# Patient Record
Sex: Female | Born: 1955 | Race: Black or African American | Hispanic: No | Marital: Married | Smoking: Former smoker
Health system: Southern US, Community
[De-identification: ages and names within clinical notes are randomized; demographics above are authoritative.]

## PROBLEM LIST (undated history)

## (undated) DIAGNOSIS — N390 Urinary tract infection, site not specified: Secondary | ICD-10-CM

## (undated) DIAGNOSIS — R519 Headache, unspecified: Secondary | ICD-10-CM

## (undated) DIAGNOSIS — F329 Major depressive disorder, single episode, unspecified: Secondary | ICD-10-CM

## (undated) DIAGNOSIS — F32A Depression, unspecified: Secondary | ICD-10-CM

## (undated) DIAGNOSIS — D649 Anemia, unspecified: Secondary | ICD-10-CM

## (undated) DIAGNOSIS — I10 Essential (primary) hypertension: Secondary | ICD-10-CM

## (undated) DIAGNOSIS — F419 Anxiety disorder, unspecified: Secondary | ICD-10-CM

## (undated) DIAGNOSIS — K649 Unspecified hemorrhoids: Secondary | ICD-10-CM

## (undated) DIAGNOSIS — R1013 Epigastric pain: Secondary | ICD-10-CM

## (undated) DIAGNOSIS — H9311 Tinnitus, right ear: Secondary | ICD-10-CM

## (undated) DIAGNOSIS — K219 Gastro-esophageal reflux disease without esophagitis: Secondary | ICD-10-CM

## (undated) DIAGNOSIS — T7840XA Allergy, unspecified, initial encounter: Secondary | ICD-10-CM

## (undated) DIAGNOSIS — R51 Headache: Secondary | ICD-10-CM

## (undated) HISTORY — PX: ESOPHAGOGASTRODUODENOSCOPY: SHX1529

## (undated) HISTORY — PX: COLONOSCOPY: SHX174

## (undated) HISTORY — PX: TONSILLECTOMY: SUR1361

## (undated) HISTORY — PX: TUBAL LIGATION: SHX77

---

## 2001-04-23 ENCOUNTER — Encounter: Payer: Self-pay | Admitting: Otolaryngology

## 2001-04-23 ENCOUNTER — Encounter: Admission: RE | Admit: 2001-04-23 | Discharge: 2001-04-23 | Payer: Self-pay | Admitting: Otolaryngology

## 2004-10-01 ENCOUNTER — Ambulatory Visit: Payer: Self-pay

## 2005-03-10 ENCOUNTER — Emergency Department: Payer: Self-pay | Admitting: Emergency Medicine

## 2006-03-15 ENCOUNTER — Ambulatory Visit: Payer: Self-pay | Admitting: Unknown Physician Specialty

## 2006-04-06 ENCOUNTER — Ambulatory Visit: Payer: Self-pay | Admitting: Otolaryngology

## 2006-10-26 ENCOUNTER — Ambulatory Visit: Payer: Self-pay

## 2007-11-09 ENCOUNTER — Ambulatory Visit: Payer: Self-pay

## 2010-11-01 ENCOUNTER — Emergency Department: Payer: Self-pay | Admitting: Emergency Medicine

## 2012-03-17 ENCOUNTER — Ambulatory Visit: Payer: Self-pay | Admitting: Obstetrics and Gynecology

## 2016-04-17 ENCOUNTER — Encounter: Payer: Self-pay | Admitting: *Deleted

## 2016-04-22 ENCOUNTER — Ambulatory Visit: Payer: BLUE CROSS/BLUE SHIELD | Admitting: Certified Registered Nurse Anesthetist

## 2016-04-22 ENCOUNTER — Ambulatory Visit
Admission: RE | Admit: 2016-04-22 | Discharge: 2016-04-22 | Disposition: A | Discharge status: Home / Self Care | Payer: BLUE CROSS/BLUE SHIELD | Source: Ambulatory Visit | Attending: Unknown Physician Specialty | Admitting: Unknown Physician Specialty

## 2016-04-22 ENCOUNTER — Encounter
Admission: RE | Disposition: A | Discharge status: Home / Self Care | Payer: Self-pay | Source: Ambulatory Visit | Attending: Unknown Physician Specialty

## 2016-04-22 ENCOUNTER — Encounter: Payer: Self-pay | Admitting: Certified Registered Nurse Anesthetist

## 2016-04-22 DIAGNOSIS — F329 Major depressive disorder, single episode, unspecified: Secondary | ICD-10-CM | POA: Insufficient documentation

## 2016-04-22 DIAGNOSIS — D122 Benign neoplasm of ascending colon: Secondary | ICD-10-CM | POA: Diagnosis not present

## 2016-04-22 DIAGNOSIS — K640 First degree hemorrhoids: Secondary | ICD-10-CM | POA: Diagnosis not present

## 2016-04-22 DIAGNOSIS — Z1211 Encounter for screening for malignant neoplasm of colon: Secondary | ICD-10-CM | POA: Insufficient documentation

## 2016-04-22 DIAGNOSIS — D120 Benign neoplasm of cecum: Secondary | ICD-10-CM | POA: Insufficient documentation

## 2016-04-22 DIAGNOSIS — I10 Essential (primary) hypertension: Secondary | ICD-10-CM | POA: Diagnosis not present

## 2016-04-22 DIAGNOSIS — F419 Anxiety disorder, unspecified: Secondary | ICD-10-CM | POA: Diagnosis not present

## 2016-04-22 DIAGNOSIS — Z79899 Other long term (current) drug therapy: Secondary | ICD-10-CM | POA: Diagnosis not present

## 2016-04-22 DIAGNOSIS — K219 Gastro-esophageal reflux disease without esophagitis: Secondary | ICD-10-CM | POA: Insufficient documentation

## 2016-04-22 DIAGNOSIS — Z87891 Personal history of nicotine dependence: Secondary | ICD-10-CM | POA: Diagnosis not present

## 2016-04-22 HISTORY — DX: Anemia, unspecified: D64.9

## 2016-04-22 HISTORY — DX: Unspecified hemorrhoids: K64.9

## 2016-04-22 HISTORY — DX: Depression, unspecified: F32.A

## 2016-04-22 HISTORY — DX: Allergy, unspecified, initial encounter: T78.40XA

## 2016-04-22 HISTORY — DX: Major depressive disorder, single episode, unspecified: F32.9

## 2016-04-22 HISTORY — DX: Headache: R51

## 2016-04-22 HISTORY — DX: Headache, unspecified: R51.9

## 2016-04-22 HISTORY — PX: COLONOSCOPY WITH PROPOFOL: SHX5780

## 2016-04-22 HISTORY — DX: Epigastric pain: R10.13

## 2016-04-22 HISTORY — DX: Tinnitus, right ear: H93.11

## 2016-04-22 HISTORY — DX: Essential (primary) hypertension: I10

## 2016-04-22 HISTORY — DX: Urinary tract infection, site not specified: N39.0

## 2016-04-22 HISTORY — DX: Anxiety disorder, unspecified: F41.9

## 2016-04-22 HISTORY — DX: Gastro-esophageal reflux disease without esophagitis: K21.9

## 2016-04-22 SURGERY — COLONOSCOPY WITH PROPOFOL
Anesthesia: General

## 2016-04-22 MED ORDER — SODIUM CHLORIDE 0.9 % IV SOLN
INTRAVENOUS | Status: DC
  Administered 2016-04-22: 1000 mL via INTRAVENOUS

## 2016-04-22 MED ORDER — LIDOCAINE 2% (20 MG/ML) 5 ML SYRINGE
INTRAMUSCULAR | Status: AC
  Filled 2016-04-22: qty 5

## 2016-04-22 MED ORDER — PROPOFOL 10 MG/ML IV BOLUS
INTRAVENOUS | Status: DC | PRN
  Administered 2016-04-22: 30 mg via INTRAVENOUS

## 2016-04-22 MED ORDER — PROPOFOL 500 MG/50ML IV EMUL
INTRAVENOUS | Status: DC | PRN
  Administered 2016-04-22: 140 ug/kg/min via INTRAVENOUS

## 2016-04-22 MED ORDER — SODIUM CHLORIDE 0.9 % IV SOLN: INTRAVENOUS | Status: DC

## 2016-04-22 MED ORDER — MIDAZOLAM HCL 2 MG/2ML IJ SOLN
INTRAMUSCULAR | Status: AC
  Filled 2016-04-22: qty 2

## 2016-04-22 MED ORDER — LIDOCAINE HCL (PF) 1 % IJ SOLN
INTRAMUSCULAR | Status: AC
  Administered 2016-04-22: 0.4 mL
  Filled 2016-04-22: qty 2

## 2016-04-22 MED ORDER — MIDAZOLAM HCL 2 MG/2ML IJ SOLN
INTRAMUSCULAR | Status: DC | PRN
  Administered 2016-04-22: 1 mg via INTRAVENOUS

## 2016-04-22 MED ORDER — PROPOFOL 500 MG/50ML IV EMUL
INTRAVENOUS | Status: AC
  Filled 2016-04-22: qty 50

## 2016-04-22 MED ORDER — LIDOCAINE HCL (CARDIAC) 20 MG/ML IV SOLN
INTRAVENOUS | Status: DC | PRN
  Administered 2016-04-22: 50 mg via INTRAVENOUS

## 2016-04-22 NOTE — Anesthesia Procedure Notes (Signed)
Date/Time: 04/22/2016 9:15 AM Performed by: Johnna Acosta Pre-anesthesia Checklist: Patient identified, Emergency Drugs available, Suction available, Patient being monitored and Timeout performed Patient Re-evaluated:Patient Re-evaluated prior to inductionOxygen Delivery Method: Nasal cannula

## 2016-04-22 NOTE — H&P (Signed)
   Primary Care Physician:  No primary care provider on file. Primary Gastroenterologist:  Dr. Vira Agar  Pre-Procedure History & Physical: HPI:  Karen Mcfarland is a 60 y.o. female is here for an colonoscopy.   Past Medical History:  Diagnosis Date  . Allergic state   . Anemia   . Anxiety   . Depression   . Dyspepsia   . GERD (gastroesophageal reflux disease)   . Headache   . Hemorrhoids   . Hypertension   . Recurrent UTI   . Tinnitus of right ear     Past Surgical History:  Procedure Laterality Date  . COLONOSCOPY    . ESOPHAGOGASTRODUODENOSCOPY    . TONSILLECTOMY    . TUBAL LIGATION      Prior to Admission medications   Medication Sig Start Date End Date Taking? Authorizing Provider  citalopram (CELEXA) 10 MG tablet Take 10 mg by mouth daily.   Yes Historical Provider, MD  pantoprazole (PROTONIX) 40 MG tablet Take 40 mg by mouth daily.   Yes Historical Provider, MD    Allergies as of 04/13/2016  . (Not on File)    History reviewed. No pertinent family history.  Social History   Social History  . Marital status: Married    Spouse name: N/A  . Number of children: N/A  . Years of education: N/A   Occupational History  . Not on file.   Social History Main Topics  . Smoking status: Former Research scientist (life sciences)  . Smokeless tobacco: Former Systems developer    Types: Snuff  . Alcohol use No  . Drug use: No  . Sexual activity: Not on file   Other Topics Concern  . Not on file   Social History Narrative  . No narrative on file    Review of Systems: See HPI, otherwise negative ROS  Physical Exam: BP 138/82   Pulse 72   Temp 97.9 F (36.6 C) (Tympanic)   Resp 18   Ht 5\' 6"  (1.676 m)   Wt 77.1 kg (170 lb)   SpO2 100%   BMI 27.44 kg/m  General:   Alert,  pleasant and cooperative in NAD Head:  Normocephalic and atraumatic. Neck:  Supple; no masses or thyromegaly. Lungs:  Clear throughout to auscultation.    Heart:  Regular rate and rhythm. Abdomen:  Soft, nontender and  nondistended. Normal bowel sounds, without guarding, and without rebound.   Neurologic:  Alert and  oriented x4;  grossly normal neurologically.  Impression/Plan: Karen Mcfarland is here for an colonoscopy to be performed for screening  Risks, benefits, limitations, and alternatives regarding  colonoscopy have been reviewed with the patient.  Questions have been answered.  All parties agreeable.   Gaylyn Cheers, MD  04/22/2016, 9:14 AM

## 2016-04-22 NOTE — Anesthesia Preprocedure Evaluation (Signed)
Anesthesia Evaluation  Patient identified by MRN, date of birth, ID band Patient awake    Reviewed: Allergy & Precautions, H&P , NPO status , Patient's Chart, lab work & pertinent test results, reviewed documented beta blocker date and time   Airway Mallampati: II   Neck ROM: full    Dental  (+) Poor Dentition, Teeth Intact   Pulmonary neg pulmonary ROS, former smoker,    Pulmonary exam normal        Cardiovascular hypertension, negative cardio ROS Normal cardiovascular exam Rhythm:regular Rate:Normal     Neuro/Psych  Headaches, negative neurological ROS  negative psych ROS   GI/Hepatic negative GI ROS, Neg liver ROS, GERD  Medicated,  Endo/Other  negative endocrine ROS  Renal/GU negative Renal ROS  negative genitourinary   Musculoskeletal   Abdominal   Peds  Hematology negative hematology ROS (+) anemia ,   Anesthesia Other Findings Past Medical History: No date: Allergic state No date: Anemia No date: Anxiety No date: Depression No date: Dyspepsia No date: GERD (gastroesophageal reflux disease) No date: Headache No date: Hemorrhoids No date: Hypertension No date: Recurrent UTI No date: Tinnitus of right ear Past Surgical History: No date: COLONOSCOPY No date: ESOPHAGOGASTRODUODENOSCOPY No date: TONSILLECTOMY No date: TUBAL LIGATION BMI    Body Mass Index:  27.44 kg/m     Reproductive/Obstetrics negative OB ROS                             Anesthesia Physical Anesthesia Plan  ASA: II  Anesthesia Plan: General   Post-op Pain Management:    Induction:   Airway Management Planned:   Additional Equipment:   Intra-op Plan:   Post-operative Plan:   Informed Consent: I have reviewed the patients History and Physical, chart, labs and discussed the procedure including the risks, benefits and alternatives for the proposed anesthesia with the patient or authorized  representative who has indicated his/her understanding and acceptance.   Dental Advisory Given  Plan Discussed with: CRNA  Anesthesia Plan Comments:         Anesthesia Quick Evaluation

## 2016-04-22 NOTE — Transfer of Care (Signed)
Immediate Anesthesia Transfer of Care Note  Patient: Karen Mcfarland  Procedure(s) Performed: Procedure(s): COLONOSCOPY WITH PROPOFOL (N/A)  Patient Location: PACU  Anesthesia Type:General  Level of Consciousness: awake  Airway & Oxygen Therapy: Patient Spontanous Breathing and Patient connected to nasal cannula oxygen  Post-op Assessment: Report given to RN and Post -op Vital signs reviewed and stable  Post vital signs: Reviewed and stable  Last Vitals:  Vitals:   04/22/16 0940 04/22/16 0944  BP: 93/72 93/70  Pulse: 74 69  Resp: 18 15  Temp: (!) 35.9 C (!) 35.9 C    Last Pain:  Vitals:   04/22/16 0944  TempSrc: Tympanic         Complications: No apparent anesthesia complications

## 2016-04-22 NOTE — Op Note (Signed)
[facility] Gastroenterology Patient Name: Karen Mcfarland Procedure Date: 04/22/2016 9:16 AM MRN: [id number] Account #: [id number] Date of Birth: May 19, 1955 Admit Type: Outpatient Age: 60 Room: [facility] ENDO ROOM 4 Gender: Female Note Status: Finalized Procedure:            Colonoscopy Indications:          Screening for colorectal malignant neoplasm Providers:            Manya Silvas, MD Referring MD:         Adrian Prows (Referring MD) Medicines:            Propofol per Anesthesia Complications:        No immediate complications. Procedure:            Pre-Anesthesia Assessment:                       - After reviewing the risks and benefits, the patient                        was deemed in satisfactory condition to undergo the                        procedure.                       After obtaining informed consent, the colonoscope was                        passed under direct vision. Throughout the procedure,                        the patient's blood pressure, pulse, and oxygen                        saturations were monitored continuously. The                        Colonoscope was introduced through the anus and                        advanced to the the cecum, identified by appendiceal                        orifice and ileocecal valve. The colonoscopy was                        performed without difficulty. The patient tolerated the                        procedure well. The quality of the bowel preparation                        was excellent. Findings:      Two sessile polyps were found in the ascending colon and cecum. The       polyps were small in size. These polyps were removed with a hot snare.       Resection and retrieval were complete.      Internal hemorrhoids were found during endoscopy. The hemorrhoids were       small and Grade I (internal hemorrhoids that do not prolapse).      The  exam was otherwise without abnormality. Impression:            - Two small polyps in the ascending colon and in the                        cecum, removed with a hot snare. Resected and retrieved.                       - Internal hemorrhoids.                       - The examination was otherwise normal. Recommendation:       - Await pathology results. Manya Silvas, MD 04/22/2016 9:40:34 AM This report has been signed electronically. Number of Addenda: 0 Note Initiated On: 04/22/2016 9:16 AM Scope Withdrawal Time: 0 hours 11 minutes 41 seconds  Total Procedure Duration: 0 hours 17 minutes 26 seconds       [facility]

## 2016-04-23 ENCOUNTER — Encounter: Payer: Self-pay | Admitting: Unknown Physician Specialty

## 2016-04-23 LAB — SURGICAL PATHOLOGY

## 2016-05-05 NOTE — Anesthesia Postprocedure Evaluation (Signed)
Anesthesia Post Note  Patient: Karen Mcfarland  Procedure(s) Performed: Procedure(s) (LRB): COLONOSCOPY WITH PROPOFOL (N/A)  Patient location during evaluation: PACU Anesthesia Type: General Level of consciousness: awake and alert Pain management: pain level controlled Vital Signs Assessment: post-procedure vital signs reviewed and stable Respiratory status: spontaneous breathing, nonlabored ventilation, respiratory function stable and patient connected to nasal cannula oxygen Cardiovascular status: blood pressure returned to baseline and stable Postop Assessment: no signs of nausea or vomiting Anesthetic complications: no     Last Vitals:  Vitals:   04/22/16 1000 04/22/16 1010  BP: 131/73 117/73  Pulse: 61 62  Resp: 12 15  Temp:      Last Pain:  Vitals:   04/22/16 0944  TempSrc: Tympanic                 Molli Barrows

## 2016-08-14 ENCOUNTER — Ambulatory Visit
Admission: RE | Admit: 2016-08-14 | Discharge: 2016-08-14 | Disposition: A | Discharge status: Home / Self Care | Payer: BLUE CROSS/BLUE SHIELD | Source: Ambulatory Visit | Attending: Infectious Diseases | Admitting: Infectious Diseases

## 2016-08-14 ENCOUNTER — Other Ambulatory Visit: Payer: Self-pay | Admitting: Infectious Diseases

## 2016-08-14 DIAGNOSIS — R51 Headache: Principal | ICD-10-CM

## 2016-08-14 DIAGNOSIS — R519 Headache, unspecified: Secondary | ICD-10-CM

## 2017-08-09 ENCOUNTER — Ambulatory Visit: Payer: Self-pay | Admitting: Pharmacy Technician

## 2017-08-09 ENCOUNTER — Encounter (INDEPENDENT_AMBULATORY_CARE_PROVIDER_SITE_OTHER): Payer: Self-pay

## 2017-08-09 DIAGNOSIS — Z79899 Other long term (current) drug therapy: Secondary | ICD-10-CM

## 2017-08-09 NOTE — Progress Notes (Signed)
Completed [facility] application and contract.  Patient agreed to all terms of the [facility] contract.    Patient approved to receive medication assistance at [facility] through 2019, as long as eligibility criteria continues to be met.    Provided patient with community resource material based on her particular needs.    Referred to [facility].  [facility]

## 2017-08-22 IMAGING — CT CT HEAD W/O CM
3 series · 16 of 47 positions shown, 19 images · non-contrast
Comparison: None.

CLINICAL DATA: Acute intractable headache.

EXAM:
CT HEAD WITHOUT CONTRAST
TECHNIQUE: Contiguous axial images were obtained from the base of the skull
through the vertex without intravenous contrast.

[Series 2: head wo · axial · 0.42mm/px · z∈[-121,+4]mm · 10 of 30 slices shown, 13 images]
[im 3/30  brain]
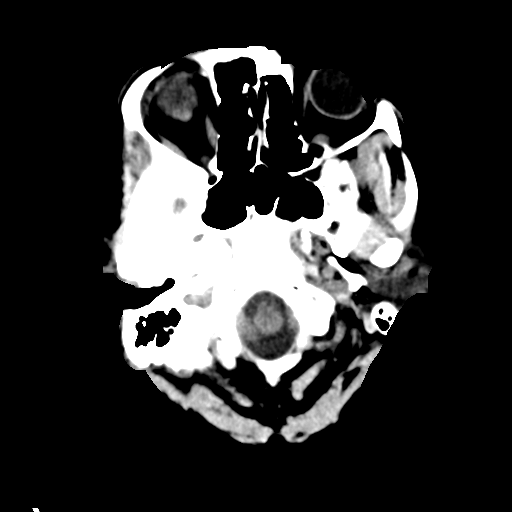
[im 3/30  bone]
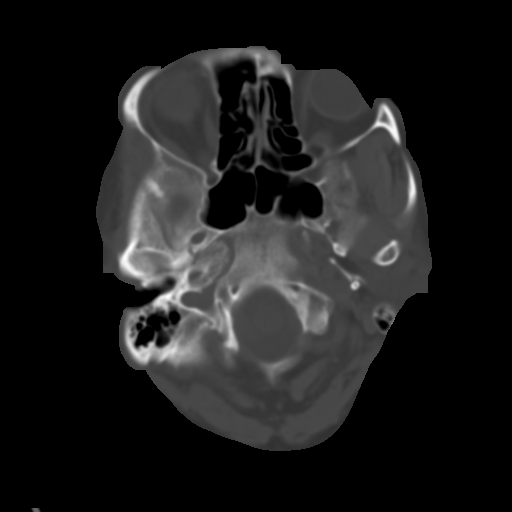
[im 6/30  brain]
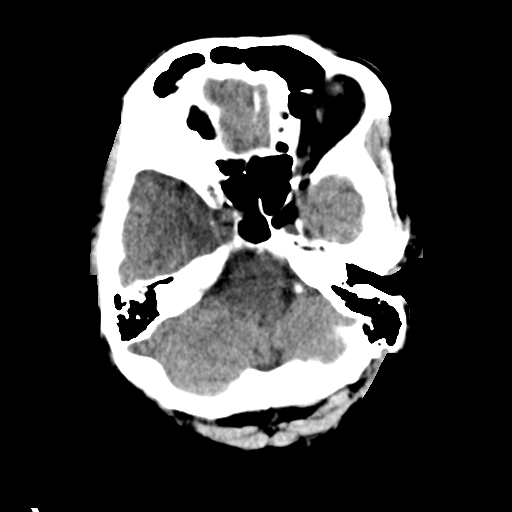
[im 9/30  brain]
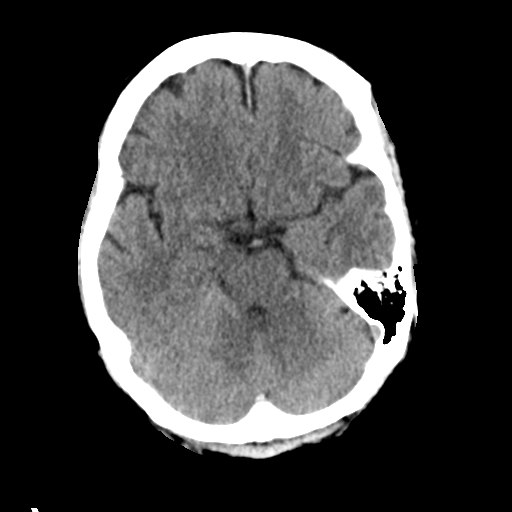
[im 11/30  brain]
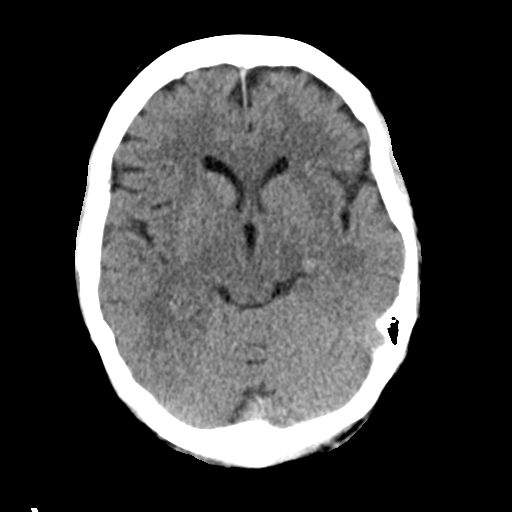
[im 14/30  brain]
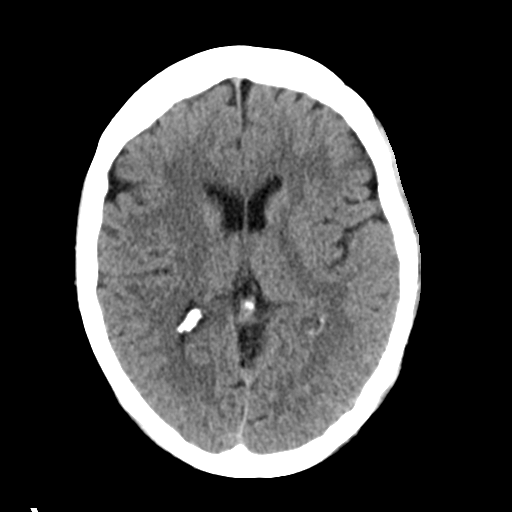
[im 14/30  bone]
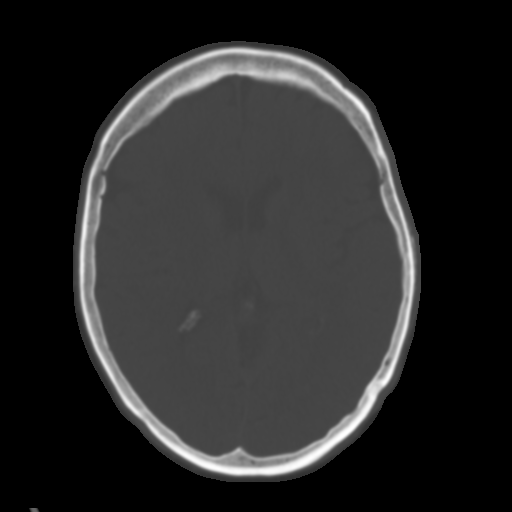
[im 17/30  brain]
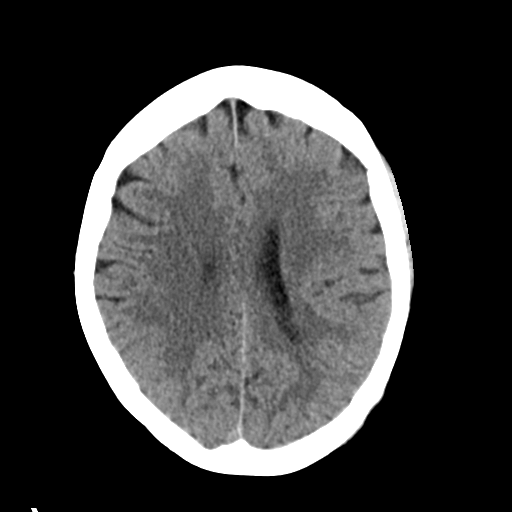
[im 20/30  brain]
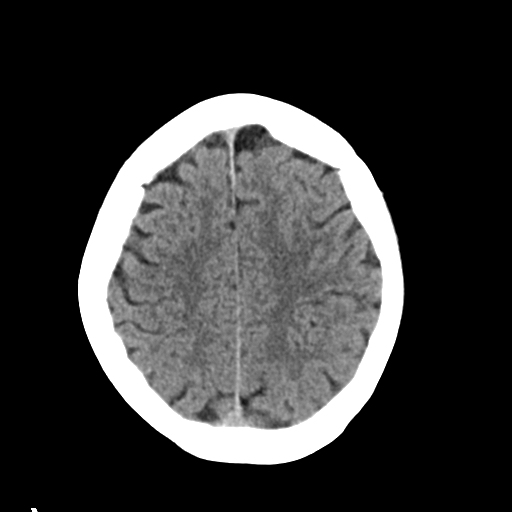
[im 23/30  brain]
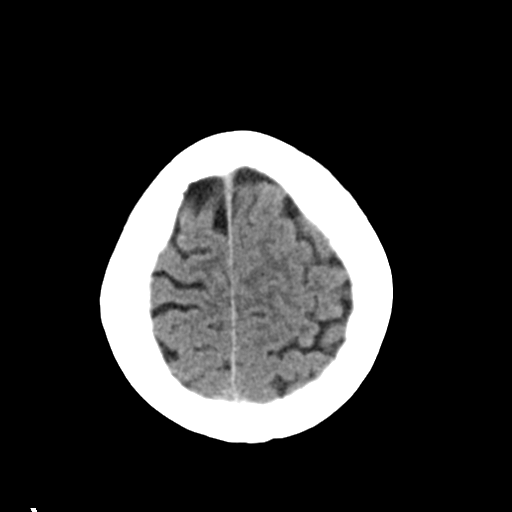
[im 25/30  brain]
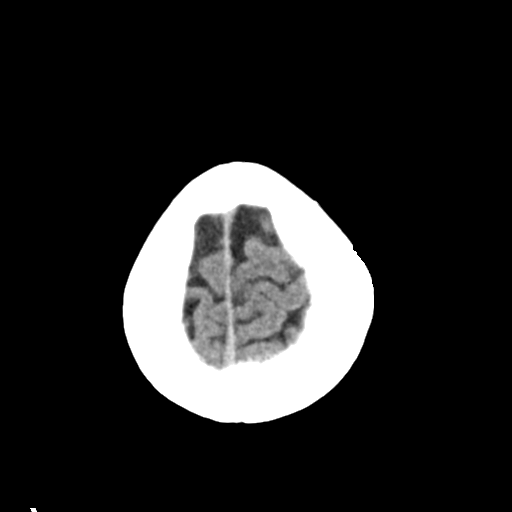
[im 25/30  bone]
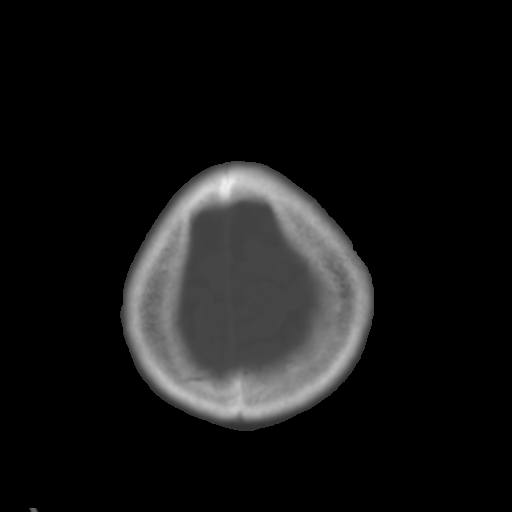
[im 28/30  brain]
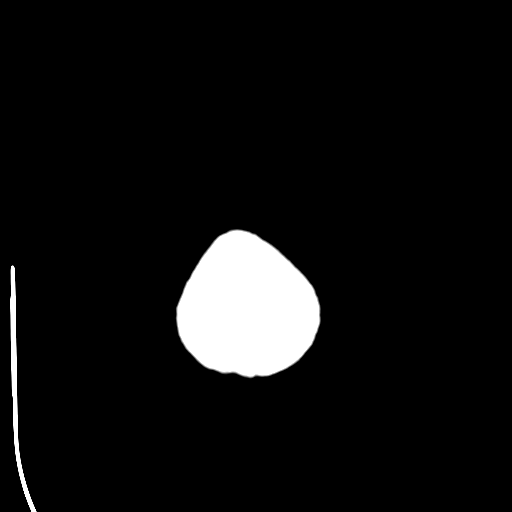

[Series 4: coronal soft tissue · coronal · 0.31mm/px · 3 of 66 slices shown]
[im 22/66  brain]
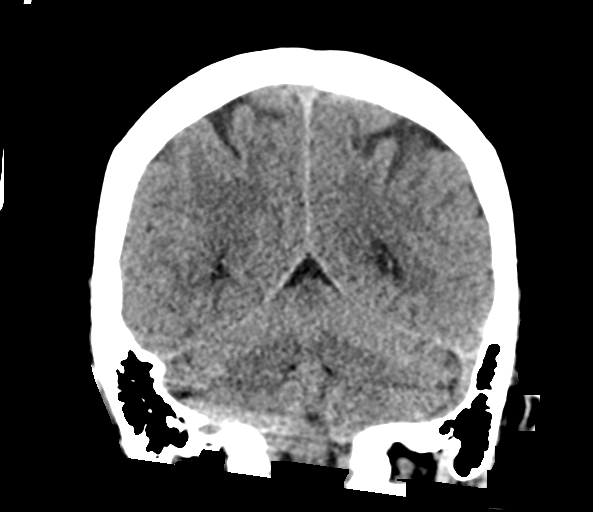
[im 29/66  brain]
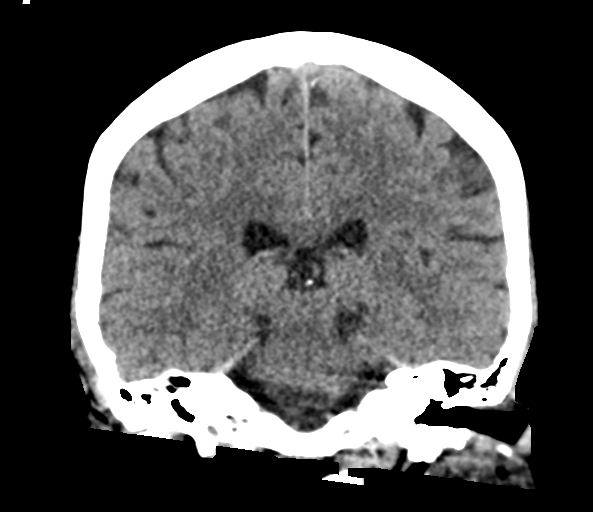
[im 37/66  brain]
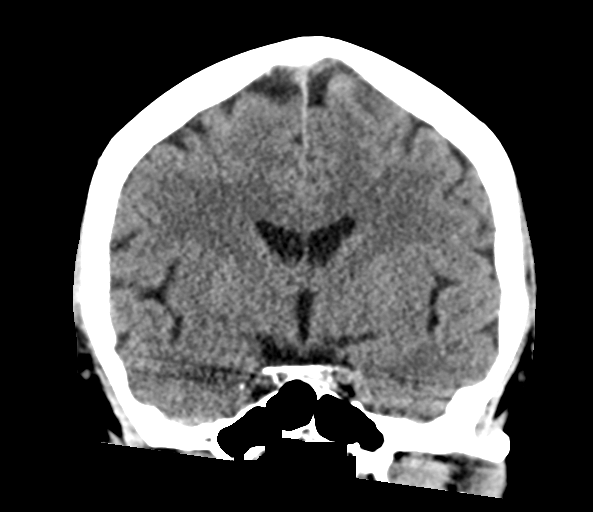

[Series 5: sagittal soft tissue · sagittal · 0.32mm/px · 3 of 53 slices shown]
[im 18/53  brain]
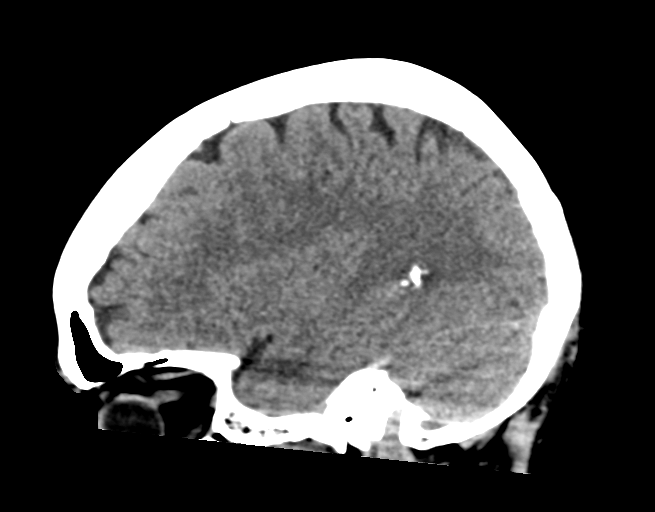
[im 27/53  brain]
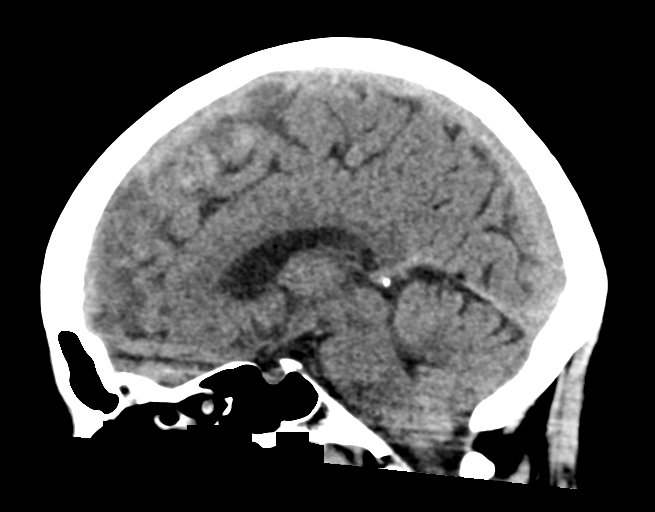
[im 35/53  brain]
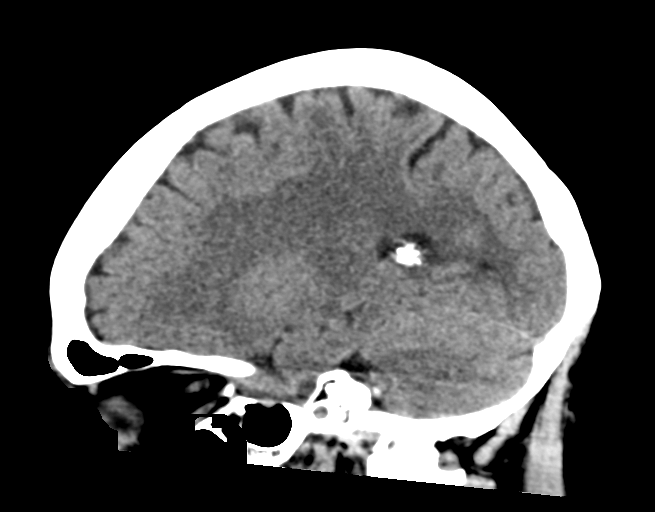

[16 of 47 positions shown; findings below may reference images not displayed]

FINDINGS: Brain: No evidence of acute infarction, hemorrhage, hydrocephalus,
extra-axial collection or mass lesion/mass effect.

Vascular: No hyperdense vessel or unexpected calcification.

Skull: Normal. Negative for fracture or focal lesion.

Sinuses/Orbits: No acute finding.

Other: None.
IMPRESSION: Normal head CT.

## 2019-01-13 ENCOUNTER — Telehealth: Payer: Self-pay | Admitting: Pharmacy Technician

## 2019-01-13 NOTE — Telephone Encounter (Signed)
Patient reached out to [facility] in April 2019 to obtain medication assistance.  Patient never requested medication assistance from [facility] and has not provided 2020 poi.  Patient being made inactive.  Pt notified by letter.  [facility]

## 2019-06-21 ENCOUNTER — Other Ambulatory Visit: Payer: Self-pay | Admitting: Internal Medicine

## 2019-06-21 DIAGNOSIS — Z1231 Encounter for screening mammogram for malignant neoplasm of breast: Secondary | ICD-10-CM

## 2020-02-01 DIAGNOSIS — K219 Gastro-esophageal reflux disease without esophagitis: Secondary | ICD-10-CM | POA: Diagnosis not present

## 2020-02-01 DIAGNOSIS — I10 Essential (primary) hypertension: Secondary | ICD-10-CM | POA: Diagnosis not present

## 2020-02-01 DIAGNOSIS — M8580 Other specified disorders of bone density and structure, unspecified site: Secondary | ICD-10-CM | POA: Diagnosis not present

## 2020-02-01 DIAGNOSIS — Z23 Encounter for immunization: Secondary | ICD-10-CM | POA: Diagnosis not present

## 2020-02-01 DIAGNOSIS — F3341 Major depressive disorder, recurrent, in partial remission: Secondary | ICD-10-CM | POA: Diagnosis not present

## 2020-07-01 ENCOUNTER — Other Ambulatory Visit: Payer: Self-pay | Admitting: Internal Medicine

## 2020-07-01 DIAGNOSIS — F3341 Major depressive disorder, recurrent, in partial remission: Secondary | ICD-10-CM | POA: Diagnosis not present

## 2020-07-01 DIAGNOSIS — Z1231 Encounter for screening mammogram for malignant neoplasm of breast: Secondary | ICD-10-CM | POA: Diagnosis not present

## 2020-07-01 DIAGNOSIS — M25512 Pain in left shoulder: Secondary | ICD-10-CM | POA: Diagnosis not present

## 2020-07-01 DIAGNOSIS — M19012 Primary osteoarthritis, left shoulder: Secondary | ICD-10-CM | POA: Diagnosis not present

## 2020-07-01 DIAGNOSIS — I10 Essential (primary) hypertension: Secondary | ICD-10-CM | POA: Diagnosis not present

## 2020-07-29 DIAGNOSIS — I10 Essential (primary) hypertension: Secondary | ICD-10-CM | POA: Diagnosis not present

## 2020-07-29 DIAGNOSIS — F3341 Major depressive disorder, recurrent, in partial remission: Secondary | ICD-10-CM | POA: Diagnosis not present

## 2020-07-29 DIAGNOSIS — M25512 Pain in left shoulder: Secondary | ICD-10-CM | POA: Diagnosis not present

## 2020-10-10 DIAGNOSIS — J069 Acute upper respiratory infection, unspecified: Secondary | ICD-10-CM | POA: Diagnosis not present

## 2020-10-10 DIAGNOSIS — Z20822 Contact with and (suspected) exposure to covid-19: Secondary | ICD-10-CM | POA: Diagnosis not present

## 2020-11-20 DIAGNOSIS — S91341A Puncture wound with foreign body, right foot, initial encounter: Secondary | ICD-10-CM | POA: Diagnosis not present

## 2020-11-20 DIAGNOSIS — F3341 Major depressive disorder, recurrent, in partial remission: Secondary | ICD-10-CM | POA: Diagnosis not present

## 2020-11-20 DIAGNOSIS — D500 Iron deficiency anemia secondary to blood loss (chronic): Secondary | ICD-10-CM | POA: Diagnosis not present

## 2020-11-20 DIAGNOSIS — M19071 Primary osteoarthritis, right ankle and foot: Secondary | ICD-10-CM | POA: Diagnosis not present

## 2020-11-20 DIAGNOSIS — E559 Vitamin D deficiency, unspecified: Secondary | ICD-10-CM | POA: Diagnosis not present

## 2020-11-20 DIAGNOSIS — I10 Essential (primary) hypertension: Secondary | ICD-10-CM | POA: Diagnosis not present

## 2020-11-20 DIAGNOSIS — R739 Hyperglycemia, unspecified: Secondary | ICD-10-CM | POA: Diagnosis not present

## 2020-11-20 DIAGNOSIS — Z0000 Encounter for general adult medical examination without abnormal findings: Secondary | ICD-10-CM | POA: Diagnosis not present

## 2020-11-20 DIAGNOSIS — Z1389 Encounter for screening for other disorder: Secondary | ICD-10-CM | POA: Diagnosis not present

## 2021-04-03 DIAGNOSIS — K5909 Other constipation: Secondary | ICD-10-CM | POA: Diagnosis not present

## 2021-04-03 DIAGNOSIS — M545 Low back pain, unspecified: Secondary | ICD-10-CM | POA: Diagnosis not present

## 2021-04-03 DIAGNOSIS — S76312A Strain of muscle, fascia and tendon of the posterior muscle group at thigh level, left thigh, initial encounter: Secondary | ICD-10-CM | POA: Diagnosis not present

## 2021-04-03 DIAGNOSIS — R1084 Generalized abdominal pain: Secondary | ICD-10-CM | POA: Diagnosis not present

## 2021-04-03 DIAGNOSIS — R109 Unspecified abdominal pain: Secondary | ICD-10-CM | POA: Diagnosis not present

## 2021-04-03 DIAGNOSIS — K5900 Constipation, unspecified: Secondary | ICD-10-CM | POA: Diagnosis not present

## 2021-05-09 DIAGNOSIS — I10 Essential (primary) hypertension: Secondary | ICD-10-CM | POA: Diagnosis not present

## 2021-05-09 DIAGNOSIS — J069 Acute upper respiratory infection, unspecified: Secondary | ICD-10-CM | POA: Diagnosis not present

## 2021-08-14 DIAGNOSIS — H6693 Otitis media, unspecified, bilateral: Secondary | ICD-10-CM | POA: Diagnosis not present

## 2021-08-14 DIAGNOSIS — Z79899 Other long term (current) drug therapy: Secondary | ICD-10-CM | POA: Diagnosis not present

## 2021-08-14 DIAGNOSIS — I10 Essential (primary) hypertension: Secondary | ICD-10-CM | POA: Diagnosis not present

## 2021-08-14 DIAGNOSIS — M1712 Unilateral primary osteoarthritis, left knee: Secondary | ICD-10-CM | POA: Diagnosis not present

## 2021-11-17 DIAGNOSIS — E559 Vitamin D deficiency, unspecified: Secondary | ICD-10-CM | POA: Diagnosis not present

## 2021-11-17 DIAGNOSIS — I10 Essential (primary) hypertension: Secondary | ICD-10-CM | POA: Diagnosis not present

## 2021-11-17 DIAGNOSIS — R739 Hyperglycemia, unspecified: Secondary | ICD-10-CM | POA: Diagnosis not present

## 2021-11-24 DIAGNOSIS — I10 Essential (primary) hypertension: Secondary | ICD-10-CM | POA: Diagnosis not present

## 2021-11-24 DIAGNOSIS — F411 Generalized anxiety disorder: Secondary | ICD-10-CM | POA: Diagnosis not present

## 2021-11-24 DIAGNOSIS — Z0000 Encounter for general adult medical examination without abnormal findings: Secondary | ICD-10-CM | POA: Diagnosis not present

## 2021-11-24 DIAGNOSIS — Z1231 Encounter for screening mammogram for malignant neoplasm of breast: Secondary | ICD-10-CM | POA: Diagnosis not present

## 2021-11-28 ENCOUNTER — Other Ambulatory Visit: Payer: Self-pay | Admitting: Internal Medicine

## 2021-11-28 DIAGNOSIS — Z1231 Encounter for screening mammogram for malignant neoplasm of breast: Secondary | ICD-10-CM

## 2021-12-25 ENCOUNTER — Ambulatory Visit
Admission: RE | Admit: 2021-12-25 | Discharge: 2021-12-25 | Disposition: A | Discharge status: Home / Self Care | Payer: Medicare HMO | Source: Ambulatory Visit | Attending: Internal Medicine | Admitting: Internal Medicine

## 2021-12-25 ENCOUNTER — Encounter: Payer: Self-pay | Admitting: Radiology

## 2021-12-25 DIAGNOSIS — F411 Generalized anxiety disorder: Secondary | ICD-10-CM | POA: Diagnosis not present

## 2021-12-25 DIAGNOSIS — H6693 Otitis media, unspecified, bilateral: Secondary | ICD-10-CM | POA: Diagnosis not present

## 2021-12-25 DIAGNOSIS — Z1231 Encounter for screening mammogram for malignant neoplasm of breast: Secondary | ICD-10-CM | POA: Diagnosis not present

## 2021-12-25 DIAGNOSIS — I10 Essential (primary) hypertension: Secondary | ICD-10-CM | POA: Diagnosis not present

## 2022-02-06 DIAGNOSIS — I10 Essential (primary) hypertension: Secondary | ICD-10-CM | POA: Diagnosis not present

## 2022-02-06 DIAGNOSIS — F411 Generalized anxiety disorder: Secondary | ICD-10-CM | POA: Diagnosis not present

## 2022-02-06 DIAGNOSIS — F4322 Adjustment disorder with anxiety: Secondary | ICD-10-CM | POA: Diagnosis not present

## 2022-03-03 DIAGNOSIS — J069 Acute upper respiratory infection, unspecified: Secondary | ICD-10-CM | POA: Diagnosis not present

## 2022-03-03 DIAGNOSIS — I10 Essential (primary) hypertension: Secondary | ICD-10-CM | POA: Diagnosis not present

## 2022-05-19 DIAGNOSIS — I10 Essential (primary) hypertension: Secondary | ICD-10-CM | POA: Diagnosis not present

## 2022-05-19 DIAGNOSIS — M170 Bilateral primary osteoarthritis of knee: Secondary | ICD-10-CM | POA: Diagnosis not present

## 2022-05-19 DIAGNOSIS — M25562 Pain in left knee: Secondary | ICD-10-CM | POA: Diagnosis not present

## 2022-05-19 DIAGNOSIS — F411 Generalized anxiety disorder: Secondary | ICD-10-CM | POA: Diagnosis not present

## 2022-05-19 DIAGNOSIS — G8929 Other chronic pain: Secondary | ICD-10-CM | POA: Diagnosis not present

## 2022-07-20 DIAGNOSIS — G8929 Other chronic pain: Secondary | ICD-10-CM | POA: Diagnosis not present

## 2022-07-20 DIAGNOSIS — M19012 Primary osteoarthritis, left shoulder: Secondary | ICD-10-CM | POA: Diagnosis not present

## 2022-07-20 DIAGNOSIS — M25561 Pain in right knee: Secondary | ICD-10-CM | POA: Diagnosis not present

## 2022-07-20 DIAGNOSIS — F411 Generalized anxiety disorder: Secondary | ICD-10-CM | POA: Diagnosis not present

## 2022-07-20 DIAGNOSIS — I10 Essential (primary) hypertension: Secondary | ICD-10-CM | POA: Diagnosis not present

## 2022-07-20 DIAGNOSIS — M7552 Bursitis of left shoulder: Secondary | ICD-10-CM | POA: Diagnosis not present

## 2022-07-22 DIAGNOSIS — M8588 Other specified disorders of bone density and structure, other site: Secondary | ICD-10-CM | POA: Diagnosis not present

## 2022-09-04 DIAGNOSIS — M25561 Pain in right knee: Secondary | ICD-10-CM | POA: Diagnosis not present

## 2022-09-04 DIAGNOSIS — I10 Essential (primary) hypertension: Secondary | ICD-10-CM | POA: Diagnosis not present

## 2022-09-04 DIAGNOSIS — M7552 Bursitis of left shoulder: Secondary | ICD-10-CM | POA: Diagnosis not present

## 2022-09-04 DIAGNOSIS — G8929 Other chronic pain: Secondary | ICD-10-CM | POA: Diagnosis not present

## 2022-09-04 DIAGNOSIS — F411 Generalized anxiety disorder: Secondary | ICD-10-CM | POA: Diagnosis not present

## 2022-12-10 ENCOUNTER — Other Ambulatory Visit: Payer: Self-pay | Admitting: Internal Medicine

## 2022-12-10 DIAGNOSIS — Z1231 Encounter for screening mammogram for malignant neoplasm of breast: Secondary | ICD-10-CM

## 2022-12-10 DIAGNOSIS — I10 Essential (primary) hypertension: Secondary | ICD-10-CM | POA: Diagnosis not present

## 2022-12-10 DIAGNOSIS — E559 Vitamin D deficiency, unspecified: Secondary | ICD-10-CM | POA: Diagnosis not present

## 2022-12-10 DIAGNOSIS — Z0000 Encounter for general adult medical examination without abnormal findings: Secondary | ICD-10-CM | POA: Diagnosis not present

## 2022-12-10 DIAGNOSIS — Z1331 Encounter for screening for depression: Secondary | ICD-10-CM | POA: Diagnosis not present

## 2022-12-10 DIAGNOSIS — R739 Hyperglycemia, unspecified: Secondary | ICD-10-CM | POA: Diagnosis not present

## 2022-12-10 DIAGNOSIS — F411 Generalized anxiety disorder: Secondary | ICD-10-CM | POA: Diagnosis not present

## 2022-12-31 ENCOUNTER — Ambulatory Visit
Admission: RE | Admit: 2022-12-31 | Discharge: 2022-12-31 | Disposition: A | Discharge status: Home / Self Care | Payer: Medicare HMO | Source: Ambulatory Visit | Attending: Internal Medicine | Admitting: Internal Medicine

## 2022-12-31 DIAGNOSIS — Z1231 Encounter for screening mammogram for malignant neoplasm of breast: Secondary | ICD-10-CM | POA: Diagnosis not present

## 2023-01-13 DIAGNOSIS — R0981 Nasal congestion: Secondary | ICD-10-CM | POA: Diagnosis not present

## 2023-01-13 DIAGNOSIS — Z03818 Encounter for observation for suspected exposure to other biological agents ruled out: Secondary | ICD-10-CM | POA: Diagnosis not present

## 2023-03-18 DIAGNOSIS — G44229 Chronic tension-type headache, not intractable: Secondary | ICD-10-CM | POA: Diagnosis not present

## 2023-03-18 DIAGNOSIS — F411 Generalized anxiety disorder: Secondary | ICD-10-CM | POA: Diagnosis not present

## 2023-03-18 DIAGNOSIS — I10 Essential (primary) hypertension: Secondary | ICD-10-CM | POA: Diagnosis not present

## 2023-05-20 DIAGNOSIS — I10 Essential (primary) hypertension: Secondary | ICD-10-CM | POA: Diagnosis not present

## 2023-05-20 DIAGNOSIS — Z599 Problem related to housing and economic circumstances, unspecified: Secondary | ICD-10-CM | POA: Diagnosis not present

## 2023-05-20 DIAGNOSIS — F411 Generalized anxiety disorder: Secondary | ICD-10-CM | POA: Diagnosis not present

## 2023-06-21 DIAGNOSIS — G8929 Other chronic pain: Secondary | ICD-10-CM | POA: Diagnosis not present

## 2023-06-21 DIAGNOSIS — I10 Essential (primary) hypertension: Secondary | ICD-10-CM | POA: Diagnosis not present

## 2023-06-21 DIAGNOSIS — R232 Flushing: Secondary | ICD-10-CM | POA: Diagnosis not present

## 2023-06-21 DIAGNOSIS — G44229 Chronic tension-type headache, not intractable: Secondary | ICD-10-CM | POA: Diagnosis not present

## 2023-06-21 DIAGNOSIS — M1712 Unilateral primary osteoarthritis, left knee: Secondary | ICD-10-CM | POA: Diagnosis not present

## 2023-06-21 DIAGNOSIS — F411 Generalized anxiety disorder: Secondary | ICD-10-CM | POA: Diagnosis not present

## 2023-09-23 DIAGNOSIS — J069 Acute upper respiratory infection, unspecified: Secondary | ICD-10-CM | POA: Diagnosis not present

## 2023-09-23 DIAGNOSIS — J209 Acute bronchitis, unspecified: Secondary | ICD-10-CM | POA: Diagnosis not present

## 2024-01-12 ENCOUNTER — Other Ambulatory Visit: Payer: Self-pay | Admitting: Internal Medicine

## 2024-01-12 DIAGNOSIS — E538 Deficiency of other specified B group vitamins: Secondary | ICD-10-CM | POA: Diagnosis not present

## 2024-01-12 DIAGNOSIS — Z1231 Encounter for screening mammogram for malignant neoplasm of breast: Secondary | ICD-10-CM | POA: Diagnosis not present

## 2024-01-12 DIAGNOSIS — M1712 Unilateral primary osteoarthritis, left knee: Secondary | ICD-10-CM | POA: Diagnosis not present

## 2024-01-12 DIAGNOSIS — E559 Vitamin D deficiency, unspecified: Secondary | ICD-10-CM | POA: Diagnosis not present

## 2024-01-12 DIAGNOSIS — R739 Hyperglycemia, unspecified: Secondary | ICD-10-CM | POA: Diagnosis not present

## 2024-01-12 DIAGNOSIS — F411 Generalized anxiety disorder: Secondary | ICD-10-CM | POA: Diagnosis not present

## 2024-01-12 DIAGNOSIS — Z0000 Encounter for general adult medical examination without abnormal findings: Secondary | ICD-10-CM | POA: Diagnosis not present

## 2024-01-12 DIAGNOSIS — Z79899 Other long term (current) drug therapy: Secondary | ICD-10-CM | POA: Diagnosis not present

## 2024-01-12 DIAGNOSIS — Z1331 Encounter for screening for depression: Secondary | ICD-10-CM | POA: Diagnosis not present

## 2024-02-04 ENCOUNTER — Ambulatory Visit
Admission: RE | Admit: 2024-02-04 | Discharge: 2024-02-04 | Disposition: A | Discharge status: Home / Self Care | Source: Ambulatory Visit | Attending: Internal Medicine | Admitting: Internal Medicine

## 2024-02-04 DIAGNOSIS — Z1231 Encounter for screening mammogram for malignant neoplasm of breast: Secondary | ICD-10-CM | POA: Insufficient documentation
